# Patient Record
Sex: Female | Born: 1975 | Race: White | Hispanic: No | Marital: Married | Smoking: Current every day smoker
Health system: Southern US, Community
[De-identification: ages and names within clinical notes are randomized; demographics above are authoritative.]

## PROBLEM LIST (undated history)

## (undated) HISTORY — PX: BREAST ENHANCEMENT SURGERY: SHX7

---

## 1997-12-11 ENCOUNTER — Encounter: Admission: RE | Admit: 1997-12-11 | Discharge: 1998-01-17 | Payer: Self-pay | Admitting: *Deleted

## 2000-03-05 ENCOUNTER — Inpatient Hospital Stay (HOSPITAL_COMMUNITY): Admission: AD | Admit: 2000-03-05 | Discharge: 2000-03-05 | Payer: Self-pay | Admitting: Obstetrics & Gynecology

## 2000-10-12 ENCOUNTER — Emergency Department (HOSPITAL_COMMUNITY): Admission: EM | Admit: 2000-10-12 | Discharge: 2000-10-12 | Payer: Self-pay | Admitting: *Deleted

## 2000-12-24 ENCOUNTER — Emergency Department (HOSPITAL_COMMUNITY): Admission: EM | Admit: 2000-12-24 | Discharge: 2000-12-24 | Payer: Self-pay | Admitting: Emergency Medicine

## 2000-12-24 ENCOUNTER — Encounter: Payer: Self-pay | Admitting: Emergency Medicine

## 2001-09-05 ENCOUNTER — Inpatient Hospital Stay (HOSPITAL_COMMUNITY): Admission: AD | Admit: 2001-09-05 | Discharge: 2001-09-05 | Payer: Self-pay | Admitting: Obstetrics and Gynecology

## 2001-09-29 ENCOUNTER — Inpatient Hospital Stay (HOSPITAL_COMMUNITY): Admission: AD | Admit: 2001-09-29 | Discharge: 2001-09-29 | Payer: Self-pay | Admitting: Obstetrics and Gynecology

## 2002-04-12 ENCOUNTER — Inpatient Hospital Stay (HOSPITAL_COMMUNITY): Admission: AD | Admit: 2002-04-12 | Discharge: 2002-04-12 | Payer: Self-pay | Admitting: Obstetrics and Gynecology

## 2002-04-15 ENCOUNTER — Inpatient Hospital Stay (HOSPITAL_COMMUNITY): Admission: AD | Admit: 2002-04-15 | Discharge: 2002-04-17 | Payer: Self-pay | Admitting: Obstetrics and Gynecology

## 2002-05-25 ENCOUNTER — Other Ambulatory Visit: Admission: RE | Admit: 2002-05-25 | Discharge: 2002-05-25 | Payer: Self-pay | Admitting: Obstetrics and Gynecology

## 2003-05-03 ENCOUNTER — Emergency Department (HOSPITAL_COMMUNITY): Admission: EM | Admit: 2003-05-03 | Discharge: 2003-05-03 | Payer: Self-pay

## 2003-05-15 ENCOUNTER — Emergency Department (HOSPITAL_COMMUNITY): Admission: EM | Admit: 2003-05-15 | Discharge: 2003-05-15 | Payer: Self-pay | Admitting: Emergency Medicine

## 2003-05-18 ENCOUNTER — Inpatient Hospital Stay (HOSPITAL_COMMUNITY): Admission: EM | Admit: 2003-05-18 | Discharge: 2003-05-21 | Payer: Self-pay | Admitting: Psychiatry

## 2003-05-23 ENCOUNTER — Inpatient Hospital Stay (HOSPITAL_COMMUNITY): Admission: AD | Admit: 2003-05-23 | Discharge: 2003-05-23 | Payer: Self-pay | Admitting: Obstetrics and Gynecology

## 2003-05-31 ENCOUNTER — Encounter: Payer: Self-pay | Admitting: Obstetrics and Gynecology

## 2003-05-31 ENCOUNTER — Inpatient Hospital Stay (HOSPITAL_COMMUNITY): Admission: AD | Admit: 2003-05-31 | Discharge: 2003-05-31 | Payer: Self-pay | Admitting: Obstetrics and Gynecology

## 2003-06-02 ENCOUNTER — Encounter: Payer: Self-pay | Admitting: Obstetrics and Gynecology

## 2003-06-02 ENCOUNTER — Inpatient Hospital Stay (HOSPITAL_COMMUNITY): Admission: AD | Admit: 2003-06-02 | Discharge: 2003-06-02 | Payer: Self-pay | Admitting: Obstetrics and Gynecology

## 2003-06-09 ENCOUNTER — Ambulatory Visit (HOSPITAL_COMMUNITY): Admission: RE | Admit: 2003-06-09 | Discharge: 2003-06-09 | Payer: Self-pay | Admitting: Obstetrics and Gynecology

## 2003-06-09 ENCOUNTER — Encounter (INDEPENDENT_AMBULATORY_CARE_PROVIDER_SITE_OTHER): Payer: Self-pay

## 2003-09-11 ENCOUNTER — Inpatient Hospital Stay (HOSPITAL_COMMUNITY): Admission: AD | Admit: 2003-09-11 | Discharge: 2003-09-11 | Payer: Self-pay | Admitting: Obstetrics and Gynecology

## 2004-06-28 ENCOUNTER — Inpatient Hospital Stay (HOSPITAL_COMMUNITY): Admission: AD | Admit: 2004-06-28 | Discharge: 2004-06-28 | Payer: Self-pay | Admitting: Obstetrics and Gynecology

## 2004-07-24 ENCOUNTER — Other Ambulatory Visit: Admission: RE | Admit: 2004-07-24 | Discharge: 2004-07-24 | Payer: Self-pay | Admitting: Obstetrics & Gynecology

## 2004-12-06 ENCOUNTER — Ambulatory Visit (HOSPITAL_COMMUNITY): Admission: RE | Admit: 2004-12-06 | Discharge: 2004-12-06 | Payer: Self-pay | Admitting: Obstetrics and Gynecology

## 2005-01-03 ENCOUNTER — Ambulatory Visit (HOSPITAL_COMMUNITY): Admission: RE | Admit: 2005-01-03 | Discharge: 2005-01-03 | Payer: Self-pay | Admitting: Obstetrics & Gynecology

## 2005-02-03 ENCOUNTER — Inpatient Hospital Stay (HOSPITAL_COMMUNITY): Admission: AD | Admit: 2005-02-03 | Discharge: 2005-02-03 | Payer: Self-pay | Admitting: Obstetrics and Gynecology

## 2005-02-05 ENCOUNTER — Inpatient Hospital Stay (HOSPITAL_COMMUNITY): Admission: AD | Admit: 2005-02-05 | Discharge: 2005-02-07 | Payer: Self-pay | Admitting: Obstetrics and Gynecology

## 2005-07-23 ENCOUNTER — Other Ambulatory Visit: Admission: RE | Admit: 2005-07-23 | Discharge: 2005-07-23 | Payer: Self-pay | Admitting: Obstetrics & Gynecology

## 2005-12-07 ENCOUNTER — Emergency Department (HOSPITAL_COMMUNITY): Admission: EM | Admit: 2005-12-07 | Discharge: 2005-12-07 | Payer: Self-pay | Admitting: Emergency Medicine

## 2005-12-12 ENCOUNTER — Other Ambulatory Visit: Admission: RE | Admit: 2005-12-12 | Discharge: 2005-12-12 | Payer: Self-pay | Admitting: Obstetrics & Gynecology

## 2006-05-19 ENCOUNTER — Ambulatory Visit (HOSPITAL_COMMUNITY): Admission: RE | Admit: 2006-05-19 | Discharge: 2006-05-19 | Payer: Self-pay | Admitting: Obstetrics and Gynecology

## 2006-05-19 ENCOUNTER — Encounter: Payer: Self-pay | Admitting: Vascular Surgery

## 2006-05-26 ENCOUNTER — Inpatient Hospital Stay (HOSPITAL_COMMUNITY): Admission: AD | Admit: 2006-05-26 | Discharge: 2006-05-26 | Payer: Self-pay | Admitting: Obstetrics and Gynecology

## 2006-05-27 ENCOUNTER — Inpatient Hospital Stay (HOSPITAL_COMMUNITY): Admission: AD | Admit: 2006-05-27 | Discharge: 2006-05-29 | Payer: Self-pay | Admitting: Obstetrics and Gynecology

## 2007-07-22 ENCOUNTER — Emergency Department (HOSPITAL_COMMUNITY): Admission: EM | Admit: 2007-07-22 | Discharge: 2007-07-23 | Payer: Self-pay | Admitting: Emergency Medicine

## 2008-06-02 IMAGING — CR DG ANKLE COMPLETE 3+V*L*
3 series · 3 of 3 positions shown · non-contrast
Comparison: none

CLINICAL DATA: Slipped and twisted ankle, pain laterally. 
 LEFT ANKLE ? 3 VIEW:

[t ankle joint ap left]
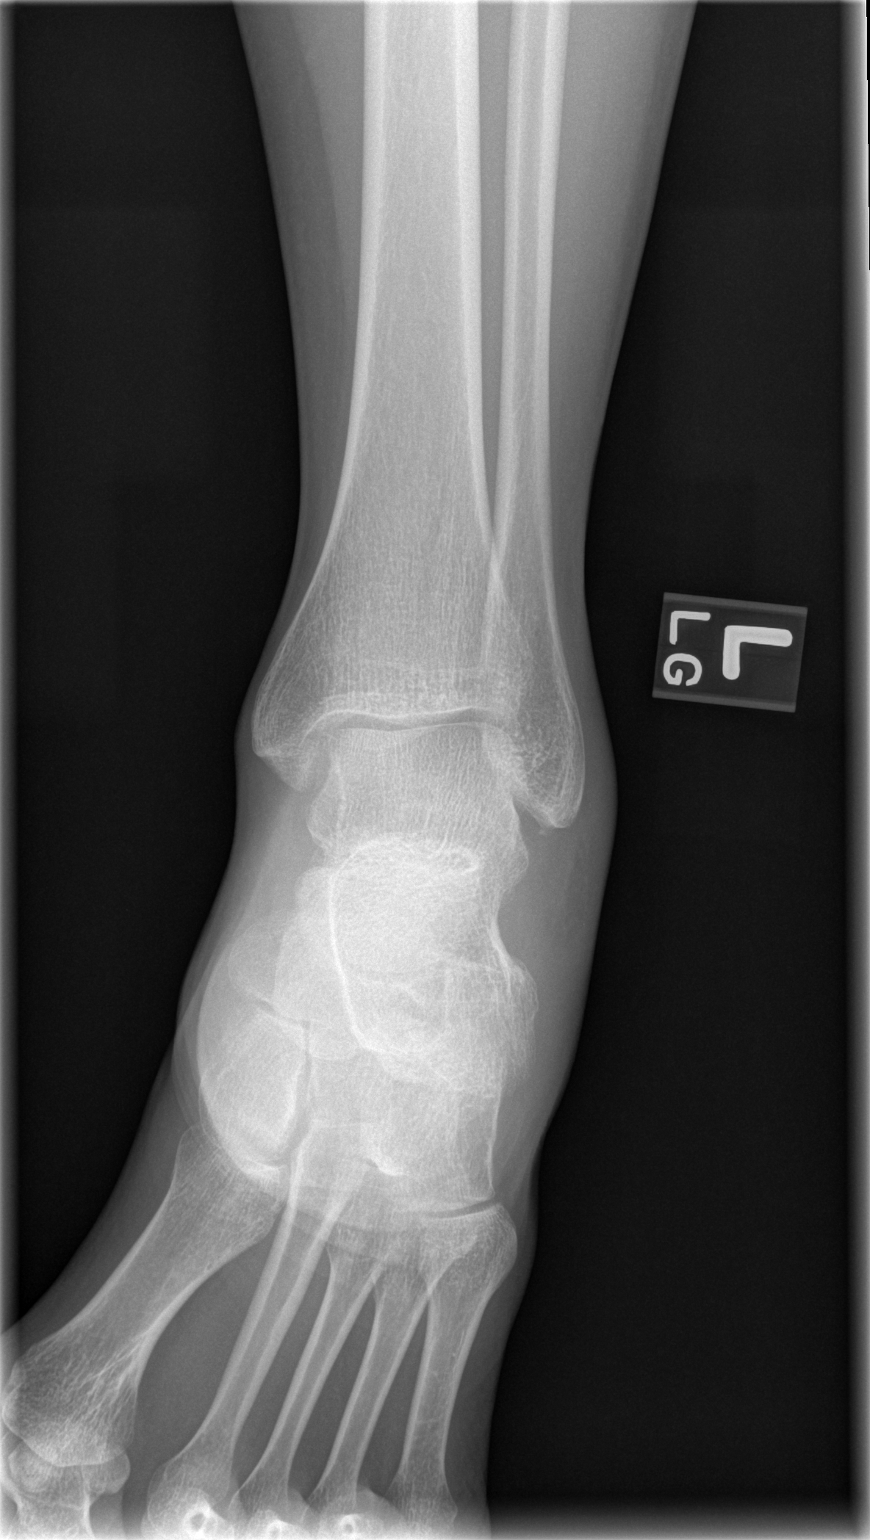

[t ankle joint oblique left]
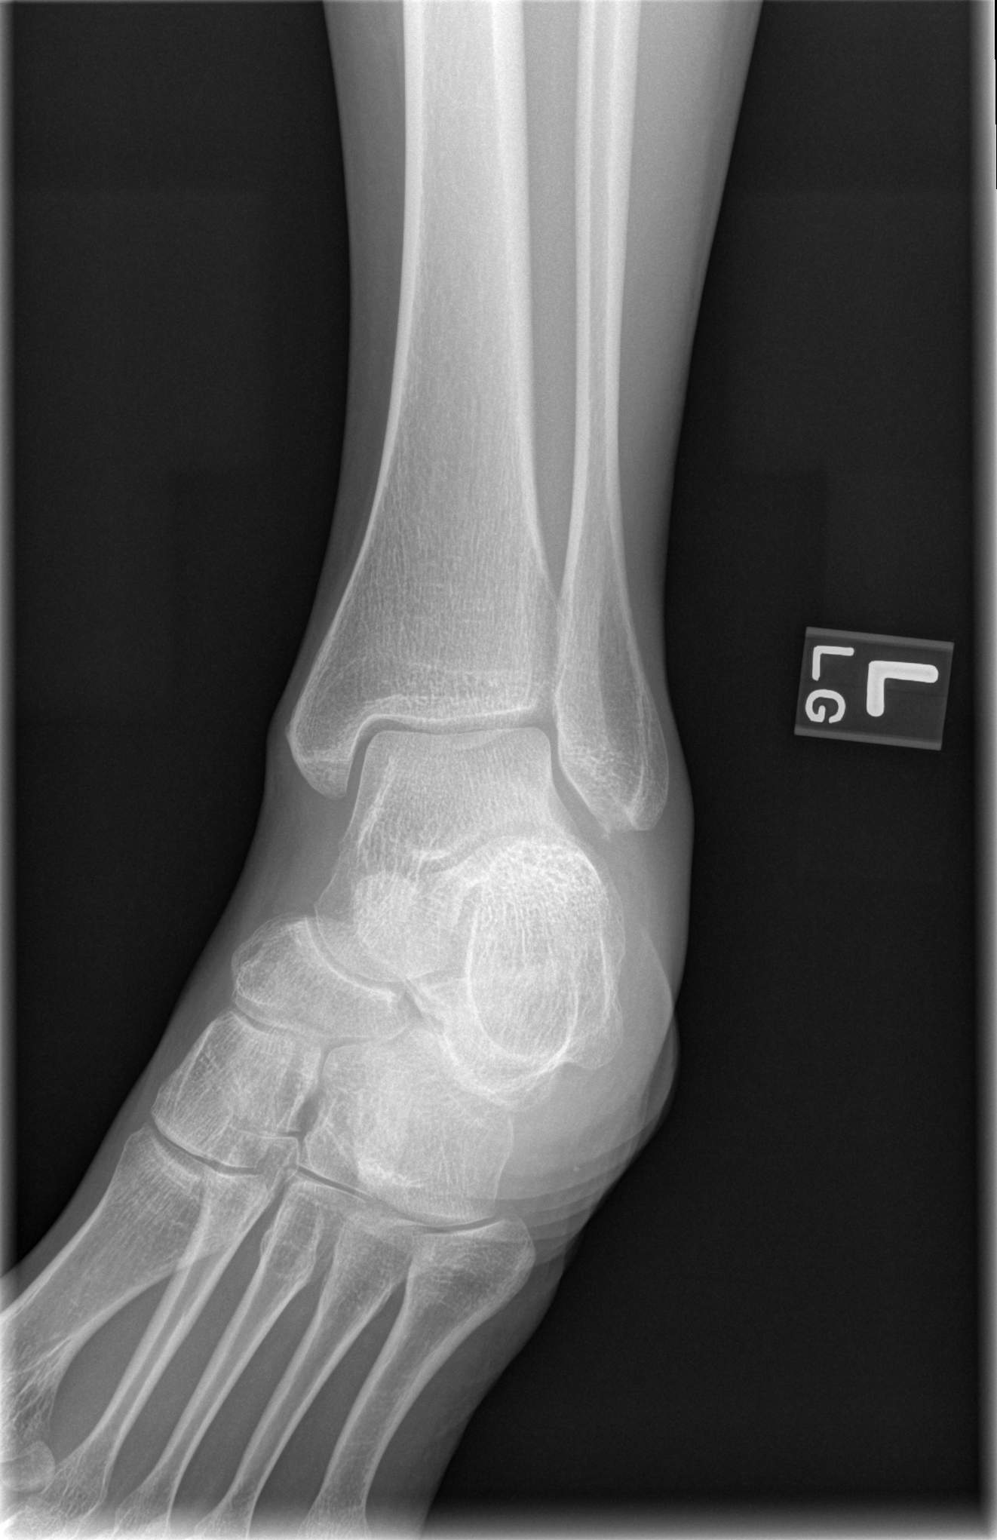

[t ankle joint lat left]
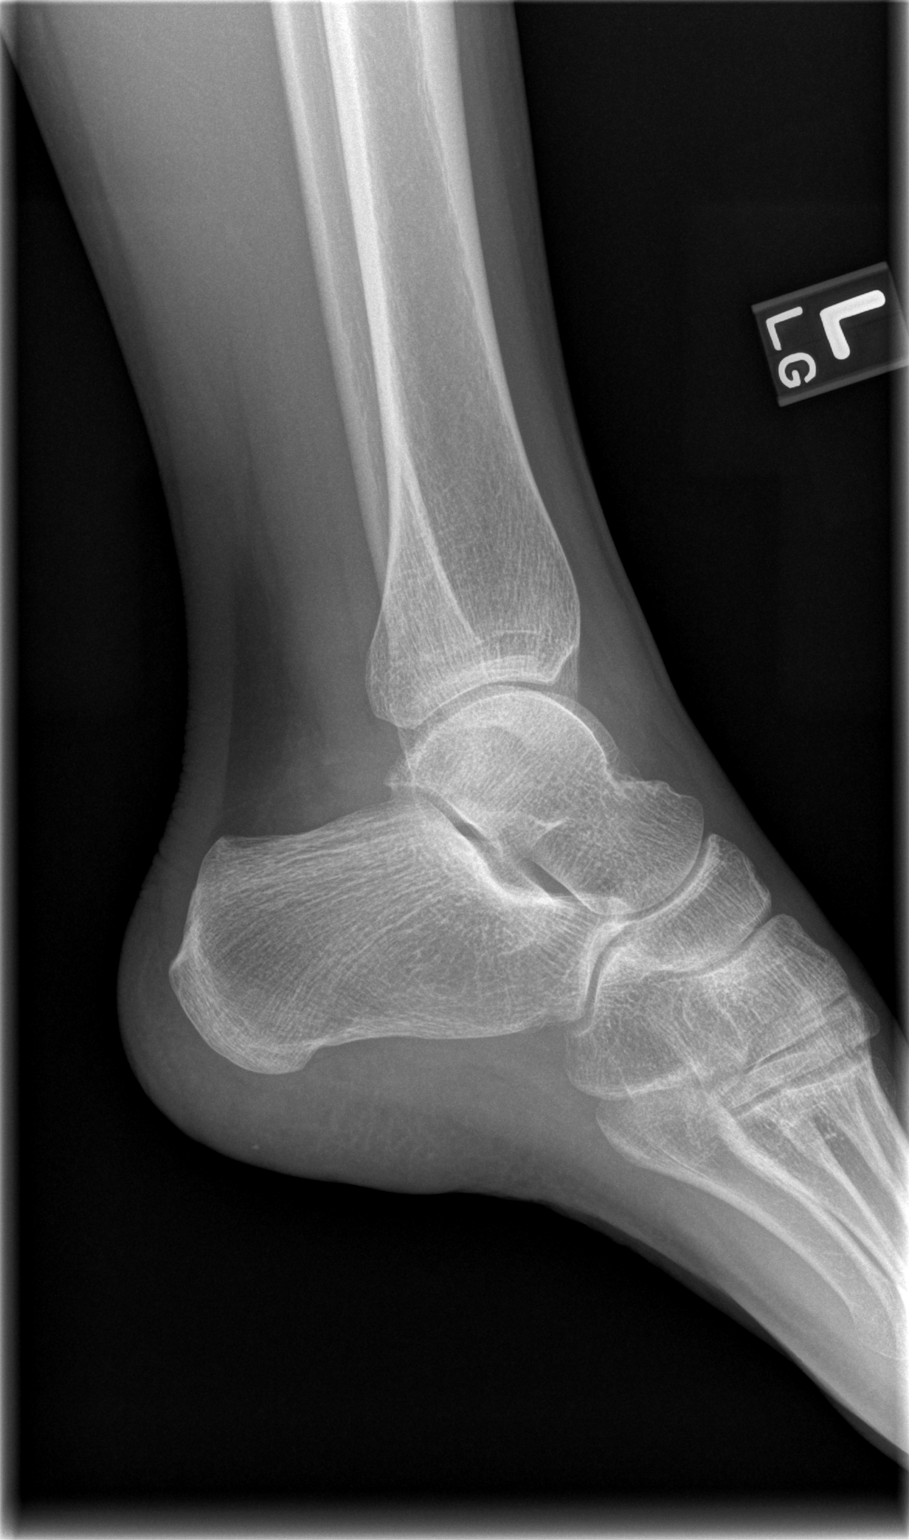

[3 of 3 positions shown; findings below may reference images not displayed]

FINDINGS: There is lateral soft tissue swelling.  There is a tiny calcific density at the tip of the fibula that could be a minimal avulsion injury.  This could also be chronic however.  No other fractures suspected.
IMPRESSION: As discussed above.

## 2011-03-28 NOTE — H&P (Signed)
NAMEJAREE, DWIGHT                        ACCOUNT NO.:  [id number]   MEDICAL RECORD NO.:  [id number]                   PATIENT TYPE:  IPS   LOCATION:  0305                                 [facility]ACILITY[facility]  [facility]   PHYSICIAN:  Carolanne Grumbling, MD                   DATE OF BIRTH:  1976/04/05   DATE OF ADMISSION:  05/18/2003  DATE OF DISCHARGE:  05/21/2003                         PSYCHIATRIC ADMISSION ASSESSMENT   IDENTIFYING INFORMATION:  A 35 year old married white female, voluntarily  admitted on May 18, 2003.   HISTORY OF PRESENT ILLNESS:  The patient presents with a history of anxiety,  feels very overwhelmed, worries all the time, having obsessive behaviors,  handwashing, housecleaning.  She has not been eating.  She has had a 10  pound weight loss.  She states that she gets dry heaves.  The patient had  gone to the emergency department, was treated for hypertension.  She states  overall she just feels very tired and exhausted.  She wants to go home and  get better.  She feels that she is having some stress involved with custody  hearing, also having no insurance.  She states most of her anxiety is at  night where she feels very panicky.  She denies any hallucinations, denies  any suicidal ideation.   PAST PSYCHIATRIC HISTORY:  First admission to [facility], no  other current outpatient treatment, no history of a suicide attempt.   SOCIAL HISTORY:  She is a 35 year old married white female, married for 2  years, first marriage.  Two children ages 75 and 1.  The children are  currently with the husband, the father of these children.  The patient  reports she also had a miscarriage about 3-4 months ago.  She was 4 weeks'  pregnant.  She lives with her husband and children.  She is not working.  Reports no legal problems.   FAMILY HISTORY:  She denies.   ALCOHOL DRUG HISTORY:  She smokes, denies any alcohol or drug use.   PAST MEDICAL HISTORY:  Primary care Jatniel Verastegui:   Has an upcoming appointment  at [facility].  Medical problems:  Some question of anemia.   MEDICATIONS:  Was on Paxil CR 25 mg a long time ago, nothing currently.   DRUG ALLERGIES:  No known allergies.   PHYSICAL EXAMINATION:  Done at [facility] on Monday prior to this admission  for dehydration.  The patient today appears very thin, in no acute distress  however.   REVIEW OF SYSTEMS:  Significant for smoking, no shortness of breath or  cardiac problems.  History of anemia.  Has been treated recently for  dehydration, received some IV fluids and Phenergan.  Sexually active, had a  recent miscarriage.  No musculoskeletal or skin problems.  Her vital signs:  She is 5 feet 6 inches tall, she is 92.5 pounds.  Temperature 99.4, pulse  116, respirations 18,  blood pressure is 151/130.   LABORATORY DATA:  CBC is within normal limits.  CMET within normal limits.  Urinalysis is negative.  Urine drug screen is positive for marijuana.  TSH  is 1.070.   MENTAL STATUS EXAM:  She is an alert, thin female, cooperative, fair eye  contact.  Speech is clear, mood is anxious.  The patient also appears very  anxious and tearful.  The patient worried, with some compulsive behaviors.  No suicidal thoughts, no psychotic symptoms.  Cognitive function intact.  Memory is fair, judgment and insight fair.   ADMISSION DIAGNOSES:   AXIS I:  1. Anxiety disorder not otherwise specified.  2. Rule out obsessive-compulsive disorder.  3. Rule out major depression.   AXIS II:  Deferred.   AXIS III:  Status post miscarriage, hypertension.   AXIS IV:  Moderate psychosocial problems related to miscarriage, economic  problems, other psychosocial problems, medical problems and problems related  to access to healthcare services.   AXIS V:  Current 35, past year 60.   PLAN:  Voluntary admission for increasing anxiety.  Contract for safety,  check every 15 minutes.  Will stabilize mood and thinking so the patient  can  be safe.  Will initiate an antidepressant to decrease excessive worries and  behaviors.  Will have Ativan for acute anxiety.  Consider family session  with her husband.  The patient is to follow up with mental health and  [facility] for her medical problems.   TENTATIVE LENGTH OF CARE:  3-5 days.     Landry Corporal, N.P.                       Carolanne Grumbling, MD    JO/MEDQ  D:  06/12/2003  T:  06/13/2003  Job:  [id number]

## 2011-03-28 NOTE — H&P (Signed)
Jenna Lynch, Jenna Lynch              ACCOUNT NO.:  [id number]   MEDICAL RECORD NO.:  [id number]          PATIENT TYPE:  INP   LOCATION:  NA                            [facility]ACILITY:  [facility]   PHYSICIAN:  Guy Sandifer. Henderson Cloud, M.D. DATE OF BIRTH:  05-15-1976   DATE OF ADMISSION:  05/27/2006  DATE OF DISCHARGE:                                HISTORY & PHYSICAL   CHIEF COMPLAINT:  Labor.   HISTORY OF PRESENT ILLNESS:  This patient is a 35 year old white female, G8  P3, with an EDC of June 16, 2006, established by ultrasound at 7 weeks, was  examined last night for signs of labor.  She was told she was 1-cm.  Since  that time, she thinks she may be leaking on her underwear.  No gushes.  No  blood.  No headaches, blurry vision, or epigastric pain.  She does complain  of a lot of back pain and rectal pain.  There is also decreased movement of  the baby.   PAST MEDICAL HISTORY:  See prenatal history and physical.   PAST SURGICAL HISTORY:  See prenatal history and physical.   FAMILY HISTORY:  See prenatal history and physical.   OBSTETRIC HISTORY:  See prenatal history and physical.   MEDICATIONS:  Prenatal vitamins.   NO KNOWN DRUG ALLERGIES.   REVIEW OF SYSTEMS:  NEURO:  Denies headache.  CARDIAC:  No chest pain.  PULMONARY:  Denies shortness of breath.   PHYSICAL EXAMINATION:  VITAL SIGNS:  Height 5 feet 6 inches, weight 125  pounds, blood pressure 100/58.  LUNGS:  Clear to auscultation.  HEART:  Regular rate and rhythm.  BREASTS:  Not examined.  ABDOMEN:  Gravid, nontender.  PELVIC:  Speculum exam reveals no pool and negative Nitrazine.  Cervix is 3-  cm dilated, 50% effaced, -2 station, vertex.  EXTREMITIES:  Grossly within normal limits.  Deep tendon reflexes 2+ without  clonus.   ASSESSMENT:  Labor.   PLAN:  We will admit to labor and delivery.      Guy Sandifer Henderson Cloud, M.D.  Electronically Signed     JET/MEDQ  D:  05/27/2006  T:  05/27/2006  Job:  [id number]

## 2011-03-28 NOTE — Discharge Summary (Signed)
NAMENKECHI, LINEHAN                        ACCOUNT NO.:  [id number]   MEDICAL RECORD NO.:  [id number]                   PATIENT TYPE:  IPS   LOCATION:  0305                                 [facility]ILITY[facility]  [facility]   PHYSICIAN:  Carolanne Grumbling, M.D.                 DATE OF BIRTH:  02/05/1976   DATE OF ADMISSION:  05/18/2003  DATE OF DISCHARGE:  05/21/2003                                 DISCHARGE SUMMARY   INITIAL ASSESSMENT AND DIAGNOSIS:  Ms. Mun was admitted with a history of  anxiety.  She felt overwhelmed at the time of admission.  She could not stop  worrying.  She had obsessive behavior such as handwashing, housecleaning.  She also had difficulty eating, having lost ten pounds in the last few  weeks.  She felt tired, exhausted.  She acknowledged depression, but no  suicidal ideation.  She had no previous treatment inpatient or outpatient.   MENTAL STATUS EXAM:  At the time of the initial evaluation revealed an  alert, oriented woman who came to the interview willingly.  She was  cooperative.  She seemed to be anxious and she was tearful.  She talked  about her worries which included most parts of her life.  There was no  evidence of any thought disorder or other psychosis.  Short and long term  memory were intact.  Judgment seems impaired by her anxiety.  Insight was  minimal.  Intellectual functioning seemed at least average.   PHYSICAL EXAMINATION:  Noncontributory.   ADMITTING DIAGNOSES:   AXIS I:  1. Generalized anxiety disorder.  2. Rule out obsessive-compulsive behavior.  3. Rule out major depression.   AXIS II:  Deferred.   AXIS III:  Healthy.   AXIS IV:  Moderate.   AXIS V:  35/65.   FINDINGS:  The laboratory examinations were within normal limits or  noncontributory.   HOSPITAL COURSE:  While in the hospital, Ms. Furth remained anxious,  however with medication she did improve enough that she wanted to go home.  She said that being in the hospital is  making things worse because she  missed her husband and her family.  A family session was held with the  husband who wanted her home and the decision was made to discharge her  according to her wishes.  While she was in the hospital, serum pregnancy  test was obtained and came back positive.  She was very happy and they were  hoping to have more children in spite of the three they had already, she was  wishing for a couple of more at least, she says, and maybe more than that.  Consequently they were very happy.  Paxil CR should not be a problem with  the pregnancy.  The sporadic clonazepam she had taken in the hospital also  was not considered a risk for the pregnancy because it was not a continued  amount.  She was advised not to take any more medication other than the  Paxil and to follow up with the recommendations of her OB/GYN doctor after  discharge.  Because she consistently denied any threats towards herself or  anyone else, she was discharged home.   FINAL DIAGNOSES:   AXIS I:  Generalized anxiety disorder.   AXIS II:  No diagnosis.   AXIS III:  Healthy.   AXIS IV:  Moderate.   AXIS V:  55.   POST HOSPITAL CARE PLANS:  At the time of discharge, she was taking Paxil CR  25 mg daily.  She was to follow up with her gynecologist and the case  manager was to call her with appointments at the [facility].  It was expected that her medications will be  followed by her gynecologist.  There were no restrictions placed on activity  or her diet.                                               Carolanne Grumbling, M.D.    GT/MEDQ  D:  05/30/2003  T:  05/30/2003  Job:  [id number]

## 2011-03-28 NOTE — H&P (Signed)
NAMELOGANN, Jenna Lynch                        ACCOUNT NO.:  [id number]   MEDICAL RECORD NO.:  [id number]                   PATIENT TYPE:  AMB   LOCATION:  SDC                                  [facility]ILITY:  [facility]   PHYSICIAN:  James A. Ashley Royalty, M.D.             DATE OF BIRTH:  1975/12/12   DATE OF ADMISSION:  DATE OF DISCHARGE:                                HISTORY & PHYSICAL   HISTORY OF PRESENT ILLNESS:  The patient is a 35 year old gravida 5, para 1-  0-3-1 who presented to me on May 23, 2003 with possible intrauterine  pregnancy.  She states she has been at [facility] for the  last several days and was discharged on Sunday, May 21, 2003.  She had been  on Klonopin, Paxil, Xanax and multivitamins.  She states she had a serum  pregnancy test which was positive.  Subsequent ultrasound on the same day  revealed no definite intrauterine pregnancy.  For this reason we again  obtained serial beta HCG's.  The patient had HCG's as follows:  May 21, 2003 -239 international units per ml., May 23, 2003 - 718, May 24, 2003 -  913, May 30, 2003 - 5171, June 02, 2003 - 7623.  Finally the patient had  repeat beta HCG on June 07, 2003 which revealed a value of 6463  international units per ml.  She also had an ultrasound on or about June 02, 2003 which revealed a gestational sac in the uterus but no cardiac activity.  The patient presents for suction dilatation and curettage.   MEDICATIONS:  Paxil, vitamins.   PAST MEDICAL HISTORY:  Her medical history includes panic disorder.  Her  past surgical history is negative.   ALLERGIES:  Negative.   FAMILY HISTORY:  Noncontributory.   SOCIAL HISTORY:  The patient denies use of tobacco or significant alcohol.   REVIEW OF SYMPTOMS:  Noncontributory.   PHYSICAL EXAMINATION:  GENERAL:  Well-developed, well-nourished diminutive  white female in no acute distress.  VITAL SIGNS:  Patient is afebrile, vital signs are stable.  SKIN:  Warm and dry without lesions.  LYMPHS:  There is no supraclavicular, cervical or inguinal lymphadenopathy.  HEENT:  Normocephalic.  NECK:  Supple without thyromegaly.  CHEST:  Lungs clear.  CARDIAC:  Regular rate and rhythm without murmurs, rubs or gallops.  BREAST:  Examination is deferred.  ABDOMEN: Soft, nontender without masses or organomegaly.  Bowel sounds are  active.  MUSCULOSKELETAL:  Examination reveals full range of motion without edema,  cyanosis or costovertebral angle tenderness.  PELVIC:  Examination shows external genitalia within normal limits.  The  vagina and cervix were without gross lesions.  Bimanual examination reveals  the uterus to be approximately 7X6X5 cm and no adnexal masses are palpable.   IMPRESSION:  Inevitable abortion.   PLAN:  Suction dilatation and curettage.  The risks, benefits and other  alternatives  were fully discussed with the patient.  She states she  understands and accepts.  Questions are invited and answered.                                               James A. Ashley Royalty, M.D.    JAM/MEDQ  D:  06/09/2003  T:  06/09/2003  Job:  [id number]

## 2011-03-28 NOTE — Op Note (Signed)
   Jenna Lynch, HARPHAM                        ACCOUNT NO.:  [id number]   MEDICAL RECORD NO.:  [id number]                   PATIENT TYPE:  AMB   LOCATION:  SDC                                  [facility]ILITY:  [facility]   PHYSICIAN:  James A. Ashley Royalty, M.D.             DATE OF BIRTH:  11-24-1975   DATE OF PROCEDURE:  06/09/2003  DATE OF DISCHARGE:                                 OPERATIVE REPORT   PREOPERATIVE DIAGNOSIS:  Inevitable abortion.   POSTOPERATIVE DIAGNOSIS:  1. Inevitable abortion.  2. Pathology pending.   PROCEDURE:  Suction dilatation and curettage.   SURGEON:  Rudy Jew. Ashley Royalty, M.D.  \   ANESTHESIA:  Monitored anesthesia care with 1% Xylocaine paracervical block  (1 mL).   ESTIMATED BLOOD LOSS:  Less than 50 mL.   COMPLICATIONS:  None.   PACKS AND DRAINS:  None.   PROCEDURE:  The patient was taken to the operating room and placed in the  dorsal supine position.  After adequate IV sedation was administered, she  was placed in the lithotomy position and prepped and draped in the usual  manner for vaginal surgery.  Posterior weighted speculum was placed per  vagina.  The anterior lip of the cervix was grasped with a single-tooth  tenaculum.  Approximately 20 mL of 1% Xylocaine were instilled  circumferentially around the cervix to create a good paracervical block.  The uterus was then sounded to approximately 8 cm and noted to be slightly  retroverted.  The cervix was also noted to be dilated to a greater than 25  [place].  Hence, no additional cervical dilatation was required whatsoever.  An 8-mm suction curette was introduced into the uterine cavity.  Suction was  applied.  A moderate amount of apparent products of conception was delivered  through the tubing.  After several passes with the suction curette no  additional tissue was obtained.  At this point a general four quadrant sharp  curettage was performed.  All curettings were submitted together to  pathology  for histologic studies.  At this point the vaginal instruments  were removed.  Hemostasis noted and the procedure terminated.   The patient tolerated the procedure extremely well and was returned to the  recovery room in good condition.                                               James A. Ashley Royalty, M.D.    JAM/MEDQ  D:  06/09/2003  T:  06/09/2003  Job:  [id number]

## 2012-09-27 ENCOUNTER — Ambulatory Visit: Payer: Self-pay | Admitting: Family Medicine

## 2014-02-22 ENCOUNTER — Emergency Department (HOSPITAL_COMMUNITY)
Admission: EM | Admit: 2014-02-22 | Discharge: 2014-02-22 | Disposition: A | Discharge status: Home / Self Care | Payer: BC Managed Care – PPO | Attending: Emergency Medicine | Admitting: Emergency Medicine

## 2014-02-22 ENCOUNTER — Encounter (HOSPITAL_COMMUNITY): Payer: Self-pay | Admitting: Emergency Medicine

## 2014-02-22 DIAGNOSIS — Y9389 Activity, other specified: Secondary | ICD-10-CM | POA: Insufficient documentation

## 2014-02-22 DIAGNOSIS — S8001XA Contusion of right knee, initial encounter: Secondary | ICD-10-CM

## 2014-02-22 DIAGNOSIS — S8000XA Contusion of unspecified knee, initial encounter: Secondary | ICD-10-CM | POA: Insufficient documentation

## 2014-02-22 DIAGNOSIS — IMO0002 Reserved for concepts with insufficient information to code with codable children: Secondary | ICD-10-CM | POA: Insufficient documentation

## 2014-02-22 DIAGNOSIS — S0990XA Unspecified injury of head, initial encounter: Secondary | ICD-10-CM | POA: Insufficient documentation

## 2014-02-22 DIAGNOSIS — S199XXA Unspecified injury of neck, initial encounter: Secondary | ICD-10-CM

## 2014-02-22 DIAGNOSIS — Y92410 Unspecified street and highway as the place of occurrence of the external cause: Secondary | ICD-10-CM | POA: Insufficient documentation

## 2014-02-22 DIAGNOSIS — S43499A Other sprain of unspecified shoulder joint, initial encounter: Secondary | ICD-10-CM | POA: Insufficient documentation

## 2014-02-22 DIAGNOSIS — S39012A Strain of muscle, fascia and tendon of lower back, initial encounter: Secondary | ICD-10-CM

## 2014-02-22 DIAGNOSIS — S0993XA Unspecified injury of face, initial encounter: Secondary | ICD-10-CM | POA: Insufficient documentation

## 2014-02-22 DIAGNOSIS — S46811A Strain of other muscles, fascia and tendons at shoulder and upper arm level, right arm, initial encounter: Secondary | ICD-10-CM

## 2014-02-22 DIAGNOSIS — F17200 Nicotine dependence, unspecified, uncomplicated: Secondary | ICD-10-CM | POA: Insufficient documentation

## 2014-02-22 DIAGNOSIS — F411 Generalized anxiety disorder: Secondary | ICD-10-CM | POA: Insufficient documentation

## 2014-02-22 DIAGNOSIS — S335XXA Sprain of ligaments of lumbar spine, initial encounter: Secondary | ICD-10-CM | POA: Insufficient documentation

## 2014-02-22 DIAGNOSIS — S8002XA Contusion of left knee, initial encounter: Secondary | ICD-10-CM

## 2014-02-22 DIAGNOSIS — S46819A Strain of other muscles, fascia and tendons at shoulder and upper arm level, unspecified arm, initial encounter: Secondary | ICD-10-CM

## 2014-02-22 MED ORDER — IBUPROFEN 600 MG PO TABS: 600.0000 mg | ORAL_TABLET | Freq: Four times a day (QID) | ORAL | Status: AC | PRN

## 2014-02-22 MED ORDER — DIAZEPAM 5 MG PO TABS: 5.0000 mg | ORAL_TABLET | Freq: Two times a day (BID) | ORAL | Status: AC

## 2014-02-22 MED ORDER — HYDROCODONE-ACETAMINOPHEN 5-325 MG PO TABS: 1.0000 | ORAL_TABLET | Freq: Four times a day (QID) | ORAL | Status: AC | PRN

## 2014-02-22 MED ORDER — DIAZEPAM 2 MG PO TABS
2.0000 mg | ORAL_TABLET | Freq: Once | ORAL | Status: AC
  Administered 2014-02-22: 2 mg via ORAL
  Filled 2014-02-22: qty 1

## 2014-02-22 MED ORDER — OXYCODONE-ACETAMINOPHEN 5-325 MG PO TABS
2.0000 | ORAL_TABLET | Freq: Once | ORAL | Status: AC
  Administered 2014-02-22: 2 via ORAL
  Filled 2014-02-22: qty 2
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
## 2014-02-22 NOTE — Discharge Instructions (Signed)
Recommend you take ibuprofen and Valium as prescribed for symptoms. Apply ice to affected areas 3-4 times per day for 20-30 minutes each time. You may take Percocet as prescribed for severe pain. Recommend you followup with your primary care doctor in refrain from strenuous activity and heavy lifting for the next week. Return to the emergency department if symptoms worsen such as if you develop loss of consciousness, vomiting, numbness/weakness of your extremities, vision loss or changes, loss of your bowel or bladder function, or an inability to walk.  Muscle Strain A muscle strain is an injury that occurs when a muscle is stretched beyond its normal length. Usually a small number of muscle fibers are torn when this happens. Muscle strain is rated in degrees. First-degree strains have the least amount of muscle fiber tearing and pain. Second-degree and third-degree strains have increasingly more tearing and pain.  Usually, recovery from muscle strain takes 1 2 weeks. Complete healing takes 5 6 weeks.  CAUSES  Muscle strain happens when a sudden, violent force placed on a muscle stretches it too far. This may occur with lifting, sports, or a fall.  RISK FACTORS Muscle strain is especially common in athletes.  SIGNS AND SYMPTOMS At the site of the muscle strain, there may be:  Pain.  Bruising.  Swelling.  Difficulty using the muscle due to pain or lack of normal function. DIAGNOSIS  Your health care provider will perform a physical exam and ask about your medical history. TREATMENT  Often, the best treatment for a muscle strain is resting, icing, and applying cold compresses to the injured area.  HOME CARE INSTRUCTIONS   Use the PRICE method of treatment to promote muscle healing during the first 2 3 days after your injury. The PRICE method involves:  Protecting the muscle from being injured again.  Restricting your activity and resting the injured body part.  Icing your injury. To do  this, put ice in a plastic bag. Place a towel between your skin and the bag. Then, apply the ice and leave it on from 15 20 minutes each hour. After the third day, switch to moist heat packs.  Apply compression to the injured area with a splint or elastic bandage. Be careful not to wrap it too tightly. This may interfere with blood circulation or increase swelling.  Elevate the injured body part above the level of your heart as often as you can.  Only take over-the-counter or prescription medicines for pain, discomfort, or fever as directed by your health care provider.  Warming up prior to exercise helps to prevent future muscle strains. SEEK MEDICAL CARE IF:   You have increasing pain or swelling in the injured area.  You have numbness, tingling, or a significant loss of strength in the injured area. MAKE SURE YOU:   Understand these instructions.  Will watch your condition.  Will get help right away if you are not doing well or get worse. Document Released: 10/27/2005 Document Revised: 08/17/2013 Document Reviewed: 05/26/2013 [facility] Patient Information 2014 [place], [place].

## 2014-02-23 NOTE — ED Provider Notes (Signed)
Medical screening examination/treatment/procedure(s) were performed by non-physician practitioner and as supervising physician I was immediately available for consultation/collaboration.   EKG Interpretation None        Osvaldo Shipper, MD 02/23/14 [phone]

## 2018-01-07 DIAGNOSIS — L570 Actinic keratosis: Secondary | ICD-10-CM

## 2018-01-07 HISTORY — DX: Actinic keratosis: L57.0

## 2019-02-24 DIAGNOSIS — C4491 Basal cell carcinoma of skin, unspecified: Secondary | ICD-10-CM

## 2019-02-24 HISTORY — DX: Basal cell carcinoma of skin, unspecified: C44.91

## 2022-04-23 ENCOUNTER — Encounter: Payer: Self-pay | Admitting: Dermatology

## 2022-04-23 ENCOUNTER — Ambulatory Visit: Payer: Medicaid Other | Admitting: Dermatology

## 2022-04-23 DIAGNOSIS — Z85828 Personal history of other malignant neoplasm of skin: Secondary | ICD-10-CM

## 2022-04-23 DIAGNOSIS — L578 Other skin changes due to chronic exposure to nonionizing radiation: Secondary | ICD-10-CM

## 2022-04-23 DIAGNOSIS — D229 Melanocytic nevi, unspecified: Secondary | ICD-10-CM | POA: Diagnosis not present

## 2022-04-23 DIAGNOSIS — D1800 Hemangioma unspecified site: Secondary | ICD-10-CM | POA: Diagnosis not present

## 2022-04-23 DIAGNOSIS — C44619 Basal cell carcinoma of skin of left upper limb, including shoulder: Secondary | ICD-10-CM | POA: Diagnosis not present

## 2022-04-23 DIAGNOSIS — D492 Neoplasm of unspecified behavior of bone, soft tissue, and skin: Secondary | ICD-10-CM

## 2022-04-23 DIAGNOSIS — L814 Other melanin hyperpigmentation: Secondary | ICD-10-CM

## 2022-04-23 DIAGNOSIS — Z1283 Encounter for screening for malignant neoplasm of skin: Secondary | ICD-10-CM

## 2022-04-23 DIAGNOSIS — S80811A Abrasion, right lower leg, initial encounter: Secondary | ICD-10-CM

## 2022-04-23 DIAGNOSIS — L821 Other seborrheic keratosis: Secondary | ICD-10-CM

## 2022-04-23 DIAGNOSIS — C4491 Basal cell carcinoma of skin, unspecified: Secondary | ICD-10-CM

## 2022-04-23 HISTORY — DX: Basal cell carcinoma of skin, unspecified: C44.91

## 2022-04-23 NOTE — Patient Instructions (Addendum)
Wound Care Instructions  Cleanse wound gently with soap and water once a day then pat dry with clean gauze. Apply a thing coat of Petrolatum (petroleum jelly, "Vaseline") over the wound (unless you have an allergy to this). We recommend that you use a new, sterile tube of Vaseline. Do not pick or remove scabs. Do not remove the yellow or white "healing tissue" from the base of the wound.  Cover the wound with fresh, clean, nonstick gauze and secure with paper tape. You may use Band-Aids in place of gauze and tape if the would is small enough, but would recommend trimming much of the tape off as there is often too much. Sometimes Band-Aids can irritate the skin.  You should call the office for your biopsy report after 1 week if you have not already been contacted.  If you experience any problems, such as abnormal amounts of bleeding, swelling, significant bruising, significant pain, or evidence of infection, please call the office immediately.  FOR ADULT SURGERY PATIENTS: If you need something for pain relief you may take 1 extra strength Tylenol (acetaminophen) AND 2 Ibuprofen (200mg each) together every 4 hours as needed for pain. (do not take these if you are allergic to them or if you have a reason you should not take them.) Typically, you may only need pain medication for 1 to 3 days.    Due to recent changes in healthcare laws, you may see results of your pathology and/or laboratory studies on MyChart before the doctors have had a chance to review them. We understand that in some cases there may be results that are confusing or concerning to you. Please understand that not all results are received at the same time and often the doctors may need to interpret multiple results in order to provide you with the best plan of care or course of treatment. Therefore, we ask that you please give us 2 business days to thoroughly review all your results before contacting the office for clarification. Should we  see a critical lab result, you will be contacted sooner.   If You Need Anything After Your Visit  If you have any questions or concerns for your doctor, please call our main line at [phone] and press option 4 to reach your doctor's medical assistant. If no one answers, please leave a voicemail as directed and we will return your call as soon as possible. Messages left after 4 pm will be answered the following business day.   You may also send us a message via MyChart. We typically respond to MyChart messages within 1-2 business days.  For prescription refills, please ask your pharmacy to contact our office. Our fax number is [phone].  If you have an urgent issue when the clinic is closed that cannot wait until the next business day, you can page your doctor at the number below.    Please note that while we do our best to be available for urgent issues outside of office hours, we are not available 24/7.   If you have an urgent issue and are unable to reach us, you may choose to seek medical care at your doctor's office, retail clinic, urgent care center, or emergency room.  If you have a medical emergency, please immediately call 911 or go to the emergency department.  Pager Numbers  - Dr. Kowalski: [phone]  - Dr. Moye: [phone]  - Dr. Stewart: [phone]  In the event of inclement weather, please call our main line at [phone]   for an update on the status of any delays or closures.  Dermatology Medication Tips: Please keep the boxes that topical medications come in in order to help keep track of the instructions about where and how to use these. Pharmacies typically print the medication instructions only on the boxes and not directly on the medication tubes.   If your medication is too expensive, please contact our office at [phone] option 4 or send us a message through MyChart.   We are unable to tell what your co-pay for medications will be in advance  as this is different depending on your insurance coverage. However, we may be able to find a substitute medication at lower cost or fill out paperwork to get insurance to cover a needed medication.   If a prior authorization is required to get your medication covered by your insurance company, please allow us 1-2 business days to complete this process.  Drug prices often vary depending on where the prescription is filled and some pharmacies may offer cheaper prices.  The website www.goodrx.com contains coupons for medications through different pharmacies. The prices here do not account for what the cost may be with help from insurance (it may be cheaper with your insurance), but the website can give you the price if you did not use any insurance.  - You can print the associated coupon and take it with your prescription to the pharmacy.  - You may also stop by our office during regular business hours and pick up a GoodRx coupon card.  - If you need your prescription sent electronically to a different pharmacy, notify our office through [facility] MyChart or by phone at [phone] option 4.     Si Usted Necesita Algo Despus de Su Visita  Tambin puede enviarnos un mensaje a travs de MyChart. Por lo general respondemos a los mensajes de MyChart en el transcurso de 1 a 2 das hbiles.  Para renovar recetas, por favor pida a su farmacia que se ponga en contacto con nuestra oficina. Nuestro nmero de fax es el [phone].  Si tiene un asunto urgente cuando la clnica est cerrada y que no puede esperar hasta el siguiente da hbil, puede llamar/localizar a su doctor(a) al nmero que aparece a continuacin.   Por favor, tenga en cuenta que aunque hacemos todo lo posible para estar disponibles para asuntos urgentes fuera del horario de oficina, no estamos disponibles las 24 horas del da, los 7 das de la semana.   Si tiene un problema urgente y no puede comunicarse con nosotros, puede optar  por buscar atencin mdica  en el consultorio de su doctor(a), en una clnica privada, en un centro de atencin urgente o en una sala de emergencias.  Si tiene una emergencia mdica, por favor llame inmediatamente al 911 o vaya a la sala de emergencias.  Nmeros de bper  - Dr. Kowalski: [phone]  - Dra. Moye: [phone]  - Dra. Stewart: [phone]  En caso de inclemencias del tiempo, por favor llame a nuestra lnea principal al [phone] para una actualizacin sobre el estado de cualquier retraso o cierre.  Consejos para la medicacin en dermatologa: Por favor, guarde las cajas en las que vienen los medicamentos de uso tpico para ayudarle a seguir las instrucciones sobre dnde y cmo usarlos. Las farmacias generalmente imprimen las instrucciones del medicamento slo en las cajas y no directamente en los tubos del medicamento.   Si su medicamento es muy caro, por favor, pngase en contacto con nuestra   oficina llamando al [phone] y presione la opcin 4 o envenos un mensaje a travs de MyChart.   No podemos decirle cul ser su copago por los medicamentos por adelantado ya que esto es diferente dependiendo de la cobertura de su seguro. Sin embargo, es posible que podamos encontrar un medicamento sustituto a menor costo o llenar un formulario para que el seguro cubra el medicamento que se considera necesario.   Si se requiere una autorizacin previa para que su compaa de seguros cubra su medicamento, por favor permtanos de 1 a 2 das hbiles para completar este proceso.  Los precios de los medicamentos varan con frecuencia dependiendo del lugar de dnde se surte la receta y alguna farmacias pueden ofrecer precios ms baratos.  El sitio web www.goodrx.com tiene cupones para medicamentos de diferentes farmacias. Los precios aqu no tienen en cuenta lo que podra costar con la ayuda del seguro (puede ser ms barato con su seguro), pero el sitio web puede darle el precio si  no utiliz ningn seguro.  - Puede imprimir el cupn correspondiente y llevarlo con su receta a la farmacia.  - Tambin puede pasar por nuestra oficina durante el horario de atencin regular y recoger una tarjeta de cupones de GoodRx.  - Si necesita que su receta se enve electrnicamente a una farmacia diferente, informe a nuestra oficina a travs de MyChart de [facility] o por telfono llamando al [phone] y presione la opcin 4.  

## 2022-04-23 NOTE — Progress Notes (Signed)
New Patient Visit  Subjective  Jenna Lynch is a 46 y.o. female who presents for the following: Annual Exam (Mole check ). The patient presents for Total-Body Skin Exam (TBSE) for skin cancer screening and mole check.  The patient has spots, moles and lesions to be evaluated, some may be new or changing and the patient has concerns that these could be cancer.   The following portions of the chart were reviewed this encounter and updated as appropriate:   Tobacco  Allergies  Meds  Problems  Med Hx  Surg Hx  Fam Hx     Review of Systems:  No other skin or systemic complaints except as noted in HPI or Assessment and Plan.  Objective  Well appearing patient in no apparent distress; mood and affect are within normal limits.  A full examination was performed including scalp, head, eyes, ears, nose, lips, neck, chest, axillae, abdomen, back, buttocks, bilateral upper extremities, bilateral lower extremities, hands, feet, fingers, toes, fingernails, and toenails. All findings within normal limits unless otherwise noted below.  Left prox bicep 2.5 cm pink patch        Left lateral deltoid 1.6 cm pink patch        Right pretibial Crust    Assessment & Plan  Neoplasm of skin (2) Left prox bicep Epidermal / dermal shaving  Lesion diameter (cm):  2.5 Informed consent: discussed and consent obtained   Timeout: patient name, date of birth, surgical site, and procedure verified   Anesthesia: the lesion was anesthetized in a standard fashion   Anesthetic:  1% lidocaine w/ epinephrine 1-100,000 local infiltration Instrument used: flexible razor blade   Hemostasis achieved with: aluminum chloride   Outcome: patient tolerated procedure well   Post-procedure details: wound care instructions given   Additional details:  Mupirocin and a bandage applied  Destruction of lesion  Destruction method: electrodesiccation and curettage   Informed consent: discussed and consent  obtained   Timeout:  patient name, date of birth, surgical site, and procedure verified Anesthesia: the lesion was anesthetized in a standard fashion   Anesthetic:  1% lidocaine w/ epinephrine 1-100,000 buffered w/ 8.4% NaHCO3 Curettage performed in three different directions: Yes   Electrodesiccation performed over the curetted area: Yes   Curettage cycles:  3 Lesion length (cm):  2.5 Lesion width (cm):  2.5 Margin per side (cm):  0.2 Final wound size (cm):  2.9 Hemostasis achieved with:  electrodesiccation Outcome: patient tolerated procedure well with no complications   Post-procedure details: sterile dressing applied and wound care instructions given   Dressing type: petrolatum    Specimen 1 - Surgical pathology Differential Diagnosis: R/O BCC  Check Margins: No 2 pieces in the container  Left lateral deltoid Epidermal / dermal shaving  Lesion diameter (cm):  1.6 Informed consent: discussed and consent obtained   Timeout: patient name, date of birth, surgical site, and procedure verified   Anesthesia: the lesion was anesthetized in a standard fashion   Anesthetic:  1% lidocaine w/ epinephrine 1-100,000 local infiltration Instrument used: flexible razor blade   Hemostasis achieved with: aluminum chloride   Outcome: patient tolerated procedure well   Post-procedure details: wound care instructions given   Additional details:  Mupirocin and a bandage applied  Destruction of lesion  Destruction method: electrodesiccation and curettage   Informed consent: discussed and consent obtained   Timeout:  patient name, date of birth, surgical site, and procedure verified Anesthesia: the lesion was anesthetized in a standard fashion  Anesthetic:  1% lidocaine w/ epinephrine 1-100,000 buffered w/ 8.4% NaHCO3 Curettage performed in three different directions: Yes   Electrodesiccation performed over the curetted area: Yes   Curettage cycles:  3 Lesion length (cm):  1.6 Lesion width  (cm):  1.6 Margin per side (cm):  0.2 Final wound size (cm):  2 Hemostasis achieved with:  electrodesiccation Outcome: patient tolerated procedure well with no complications   Post-procedure details: sterile dressing applied and wound care instructions given   Dressing type: petrolatum    Specimen 2 - Surgical pathology Differential Diagnosis: R/O BCC Check Margins: No  Crust Eduardo Osier of skin of right lower leg Right pretibial Recheck at next office visit if not gone consider biopsy  Lentigines - Scattered tan macules - Due to sun exposure - Benign-appearing, observe - Recommend daily broad spectrum sunscreen SPF 30+ to sun-exposed areas, reapply every 2 hours as needed. - Call for any changes  Seborrheic Keratoses - Stuck-on, waxy, tan-brown papules and/or plaques  - Benign-appearing - Discussed benign etiology and prognosis. - Observe - Call for any changes  Melanocytic Nevi - Tan-brown and/or pink-flesh-colored symmetric macules and papules - Benign appearing on exam today - Observation - Call clinic for new or changing moles - Recommend daily use of broad spectrum spf 30+ sunscreen to sun-exposed areas.   Hemangiomas - Red papules - Discussed benign nature - Observe - Call for any changes  Actinic Damage - Chronic condition, secondary to cumulative UV/sun exposure - diffuse scaly erythematous macules with underlying dyspigmentation - Recommend daily broad spectrum sunscreen SPF 30+ to sun-exposed areas, reapply every 2 hours as needed.  - Staying in the shade or wearing long sleeves, sun glasses (UVA+UVB protection) and wide brim hats (4-inch brim around the entire circumference of the hat) are also recommended for sun protection.  - Call for new or changing lesions.  Skin cancer screening performed today.   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  - Call if any new or changing lesions are noted between office visits   Return in about 7 months (around 11/23/2022) for TBSE, hx of BCC .  IMarye Round, CMA, am acting as scribe for Sarina Ser, MD .  Documentation: I have reviewed the above documentation for accuracy and completeness, and I agree with the above.  Sarina Ser, MD

## 2022-04-29 ENCOUNTER — Telehealth: Payer: Self-pay

## 2022-04-29 NOTE — Telephone Encounter (Signed)
-----   Message from Ralene Bathe, MD sent at 04/28/2022  4:31 PM EDT ----- Diagnosis 1. Skin (M), left prox bicep SUPERFICIAL BASAL CELL CARCINOMA, PERIPHERAL MARGIN INVOLVED 2. Skin (M), left lateral deltoid BASAL CELL CARCINOMA, NODULAR AND INFILTRATIVE PATTERNS, BASE INVOLVED  1&2 - both Cancer - BCC Both already treated Recheck next visit

## 2022-04-29 NOTE — Telephone Encounter (Signed)
Tried calling pt about bx results and no answer or vm on mobile number.  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

## 2022-04-29 NOTE — Telephone Encounter (Signed)
Patient advised of BX results .aw 

## 2022-11-24 ENCOUNTER — Ambulatory Visit: Payer: BC Managed Care – PPO | Admitting: Dermatology
